# Patient Record
Sex: Female | Born: 1990 | Race: White | Hispanic: No | Marital: Single
Health system: Southern US, Community
[De-identification: ages and names within clinical notes are randomized; demographics above are authoritative.]

## PROBLEM LIST (undated history)

## (undated) ENCOUNTER — Emergency Department (HOSPITAL_COMMUNITY): Payer: Federal, State, Local not specified - PPO

## (undated) DIAGNOSIS — F988 Other specified behavioral and emotional disorders with onset usually occurring in childhood and adolescence: Secondary | ICD-10-CM

## (undated) HISTORY — PX: NO PAST SURGERIES: SHX2092

## (undated) HISTORY — DX: Other specified behavioral and emotional disorders with onset usually occurring in childhood and adolescence: F98.8

---
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
## 2013-07-25 ENCOUNTER — Encounter: Payer: Self-pay | Admitting: *Deleted

## 2016-08-31 ENCOUNTER — Emergency Department (HOSPITAL_COMMUNITY)
Admission: EM | Admit: 2016-08-31 | Discharge: 2016-08-31 | Disposition: A | Discharge status: Home / Self Care | Payer: Federal, State, Local not specified - PPO | Attending: Emergency Medicine | Admitting: Emergency Medicine

## 2016-08-31 ENCOUNTER — Emergency Department (HOSPITAL_COMMUNITY): Payer: Federal, State, Local not specified - PPO

## 2016-08-31 DIAGNOSIS — Z79899 Other long term (current) drug therapy: Secondary | ICD-10-CM | POA: Insufficient documentation

## 2016-08-31 DIAGNOSIS — F909 Attention-deficit hyperactivity disorder, unspecified type: Secondary | ICD-10-CM | POA: Diagnosis not present

## 2016-08-31 DIAGNOSIS — R1031 Right lower quadrant pain: Secondary | ICD-10-CM | POA: Diagnosis present

## 2016-08-31 LAB — URINALYSIS, ROUTINE W REFLEX MICROSCOPIC
BILIRUBIN URINE: NEGATIVE
GLUCOSE, UA: NEGATIVE mg/dL
KETONES UR: 5 mg/dL — AB
LEUKOCYTES UA: NEGATIVE
NITRITE: NEGATIVE
PH: 5 (ref 5.0–8.0)
Protein, ur: NEGATIVE mg/dL
Specific Gravity, Urine: 1.024 (ref 1.005–1.030)

## 2016-08-31 LAB — PREGNANCY, URINE: PREG TEST UR: NEGATIVE

## 2016-08-31 NOTE — ED Provider Notes (Signed)
[facility]-EMERGENCY DEPT Provider Note   CSN: [id number] Arrival date & time: 08/31/16  4098     History   Chief Complaint Chief Complaint  Patient presents with  . Abdominal Pain    RLQ  . Urinary Tract Infection    HPI Heidi Malone is a 26 y.o. female.  HPI Patient presents emergency para complaints of severe right lower quadrant abdominal pain which began acutely this morning.  Associated nausea without vomiting.  She now reports complete resolution of all of her pain.  She started her menstrual cycle today.  No prior history kidney stones.  She does have urinary frequency without dysuria.  She denies flank pain at this time.  No new vaginal complaints.  No other issues.   Past Medical History:  Diagnosis Date  . ADD (attention deficit disorder)     There are no active problems to display for this patient.   Past Surgical History:  Procedure Laterality Date  . NO PAST SURGERIES      OB History    No data available       Home Medications    Prior to Admission medications   Medication Sig Start Date End Date Taking? Authorizing Provider  etonogestrel-ethinyl estradiol (NUVARING) 0.12-0.015 MG/24HR vaginal ring Place 1 each vaginally every 28 (twenty-eight) days. Insert vaginally and leave in place for 3 consecutive weeks, then remove for 1 week.   Yes Historical Provider, MD  ibuprofen (ADVIL,MOTRIN) 200 MG tablet Take 400 mg by mouth every 6 (six) hours as needed for moderate pain.   Yes Historical Provider, MD  amphetamine-dextroamphetamine (ADDERALL XR) 25 MG 24 hr capsule Take 25 mg by mouth as needed.     Historical Provider, MD    Family History No family history on file.  Social History Social History  Substance Use Topics  . Smoking status: Not on file  . Smokeless tobacco: Not on file  . Alcohol use Not on file     Allergies   Patient has no known allergies.   Review of Systems Review of Systems  All other systems reviewed and are  negative.    Physical Exam Updated Vital Signs BP 126/72   Pulse 65   Temp 97.3 F (36.3 C) (Oral)   Ht 5\' 10"  (1.778 m)   Wt 150 lb (68 kg)   LMP 08/31/2016 (Exact Date)   SpO2 100%   BMI 21.52 kg/m   Physical Exam  Constitutional: She is oriented to person, place, and time. She appears well-developed and well-nourished.  HENT:  Head: Normocephalic.  Eyes: EOM are normal.  Neck: Normal range of motion.  Pulmonary/Chest: Effort normal.  Abdominal: She exhibits no distension. There is no tenderness. There is no rebound and no guarding.  Musculoskeletal: Normal range of motion.  Neurological: She is alert and oriented to person, place, and time.  Psychiatric: She has a normal mood and affect.  Nursing note and vitals reviewed.    ED Treatments / Results  Labs (all labs ordered are listed, but only abnormal results are displayed) Labs Reviewed  URINALYSIS, ROUTINE W REFLEX MICROSCOPIC - Abnormal; Notable for the following:       Result Value   Hgb urine dipstick SMALL (*)    Ketones, ur 5 (*)    Bacteria, UA RARE (*)    Squamous Epithelial / LPF 0-5 (*)    All other components within normal limits  URINE CULTURE  PREGNANCY, URINE    EKG  EKG Interpretation None  Radiology [facility]  Result Date: 08/31/2016 CLINICAL DATA:  Colicky right sided pain since this morning. EXAM: RENAL / URINARY TRACT ULTRASOUND COMPLETE COMPARISON:  None. FINDINGS: Right Kidney: Length: 11.7 cm. Echogenicity within normal limits. No mass or hydronephrosis visualized. Left Kidney: Length: 11.8 cm. Echogenicity within normal limits. No mass or hydronephrosis visualized. Bladder: Appears normal for degree of bladder distention. Bilateral ureteral jets are seen in the bladder. IMPRESSION: Normal ultrasound of the kidneys and bladder.  No hydronephrosis. Electronically Signed   By: Delbert PhenixJason A Poff M.D.   On: 08/31/2016 11:34    Procedures Procedures (including critical care  time)  Medications Ordered in ED Medications - No data to display   Initial Impression / Assessment and Plan / ED Course  I have reviewed the triage vital signs and the nursing notes.  Pertinent labs & imaging results that were available during my care of the patient were reviewed by me and considered in my medical decision making (see chart for details).     Resolution of pain.  Patient does have hematuria without signs of urinary tract infection.  Urine pregnancy test is negative.  Could represent a distal right ureteral stone.  Renal ultrasound pending at this time.  Doubt ovarian torsion.  No history of ovarian cyst.  Asymptomatic at this time  Patient continues to feel good.  Urine culture sent.  Renal ultrasound demonstrated no obstruction.  Could represent an ovarian cyst as well.  Well-appearing.  No indication for additional workup.  Final Clinical Impressions(s) / ED Diagnoses   Final diagnoses:  None    New Prescriptions New Prescriptions   No medications on file     Azalia BilisKevin Rodolphe Edmonston, MD 08/31/16 1153

## 2016-08-31 NOTE — ED Triage Notes (Signed)
Pt presents to [facility]ED from home via POA c/o RLQ sharp, burning pain with accompanied nausea. She rates pain 8/10. Pain onset around 7am. She said pain was so severe and sharp that it was concerning to her. Pain fluctuates in intensity from 6-10/10. Pt did start her menstrual cycle this morning but feels this pain is not similar to normal menstrual cycles.   She is also concerned that she has a UTI. She complains of urinary frequency, small volumes when she urinates. Onset this morning.

## 2016-09-01 LAB — URINE CULTURE

## 2017-11-17 IMAGING — US US RENAL
1 series · 14 of 25 positions shown · non-contrast
Comparison: None.

CLINICAL DATA: Colicky right sided pain since this morning.

EXAM:
RENAL / URINARY TRACT ULTRASOUND COMPLETE

[Series 1: us renal · 0.23mm/px · 14 of 37 slices shown]
[im 1/37]
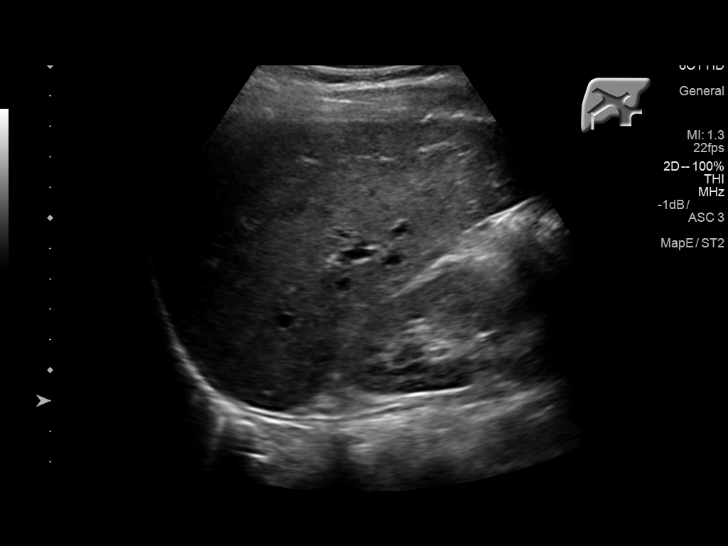
[im 4/37]
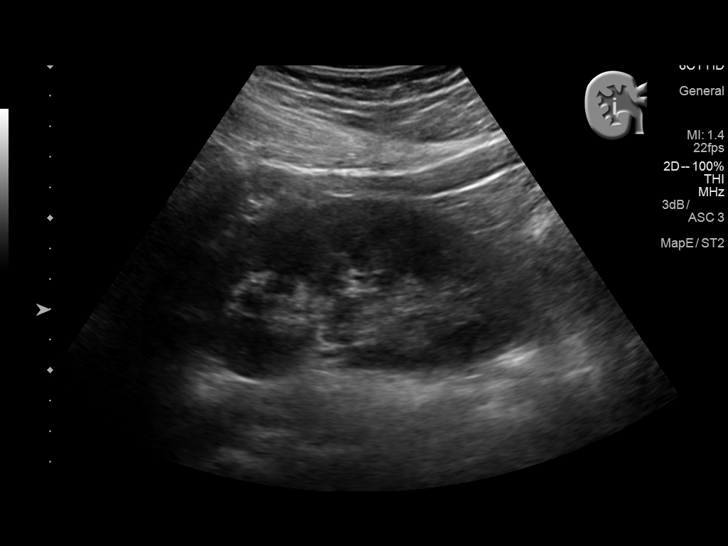
[im 7/37]
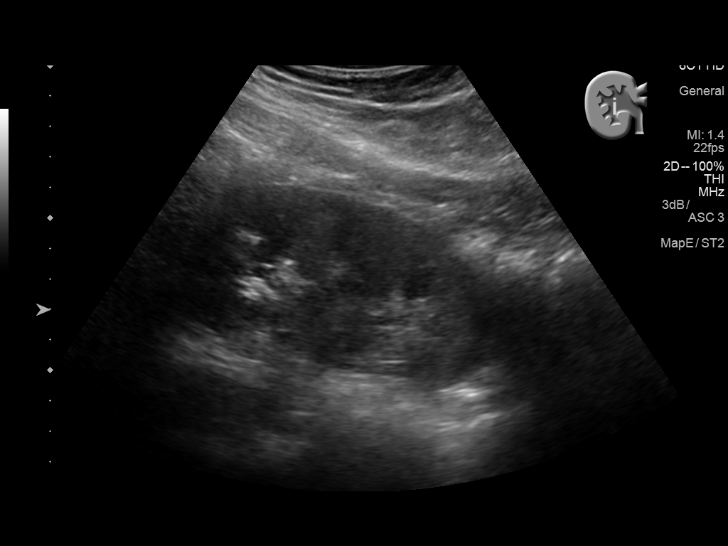
[im 10/37]
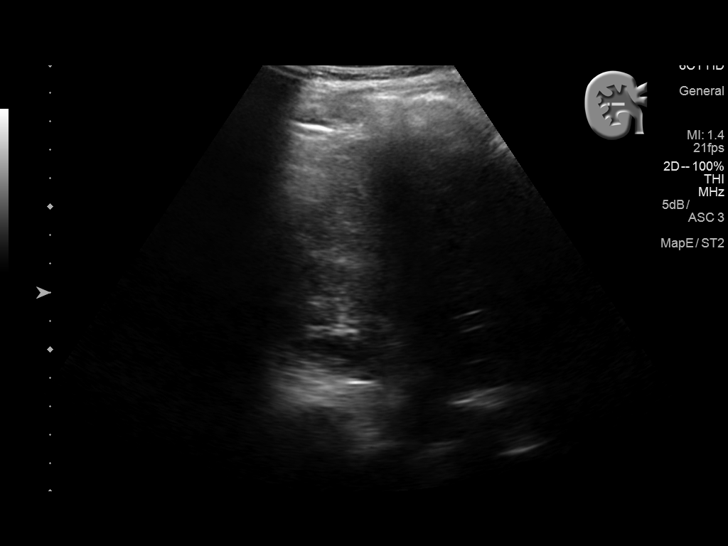
[im 13/37]
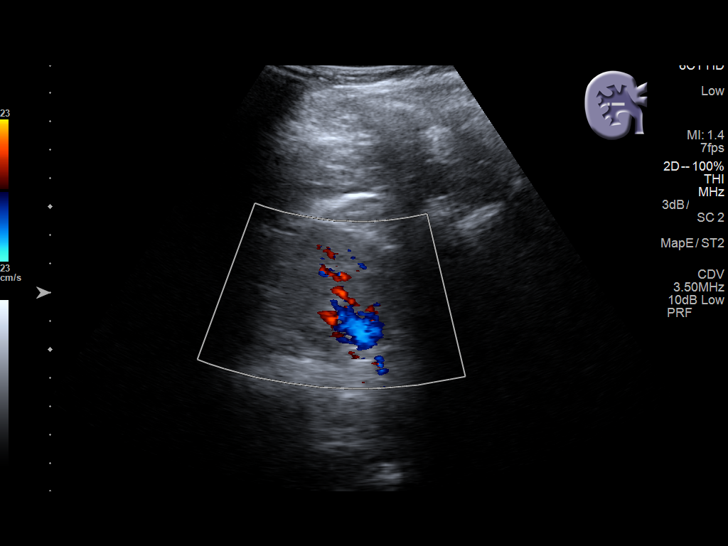
[im 14/37]
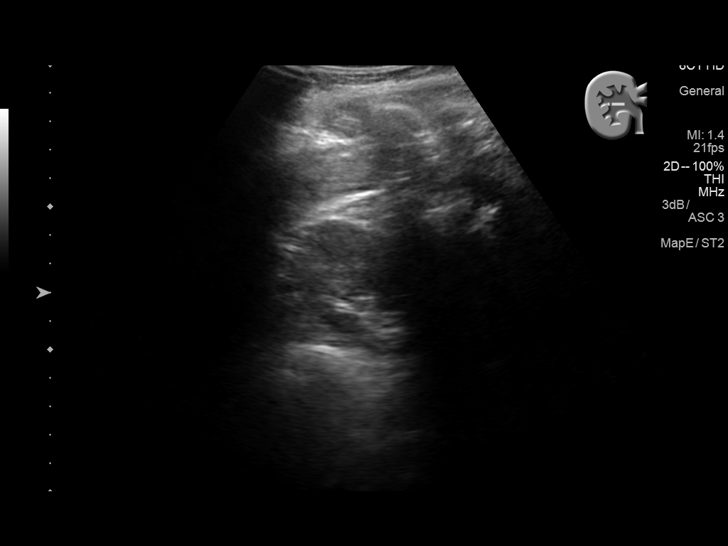
[im 17/37]
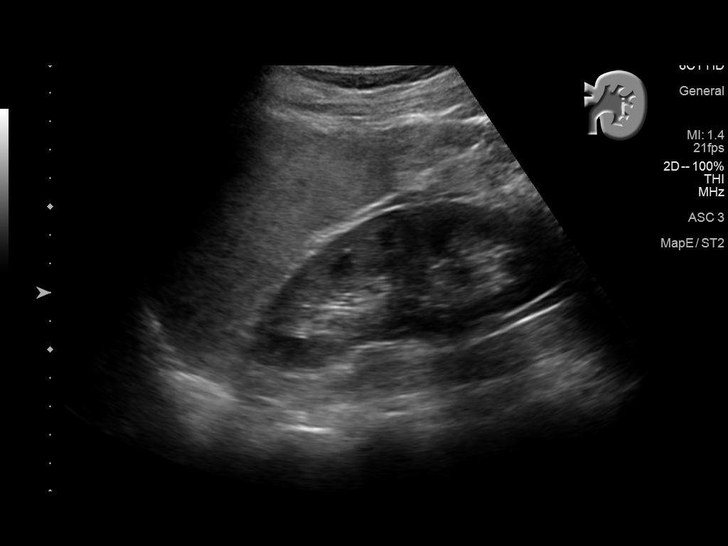
[im 20/37]
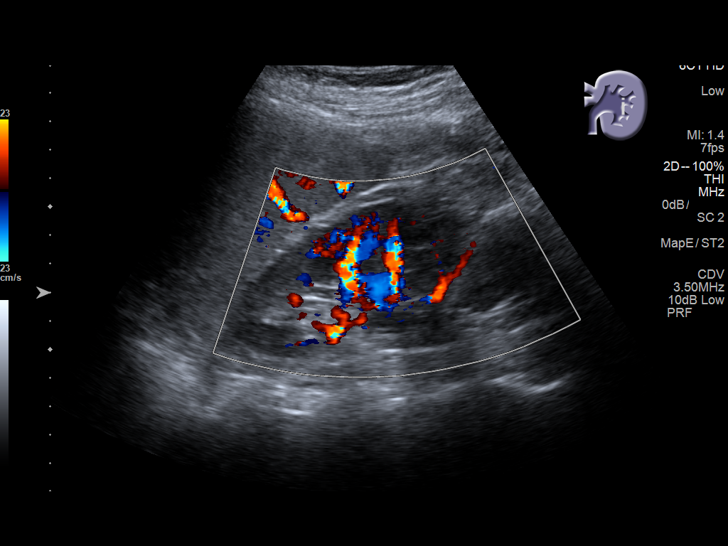
[im 23/37]
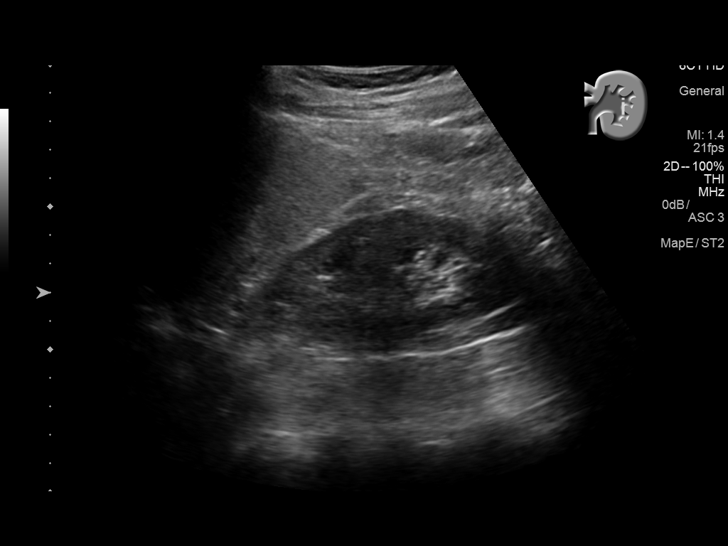
[im 25/37]
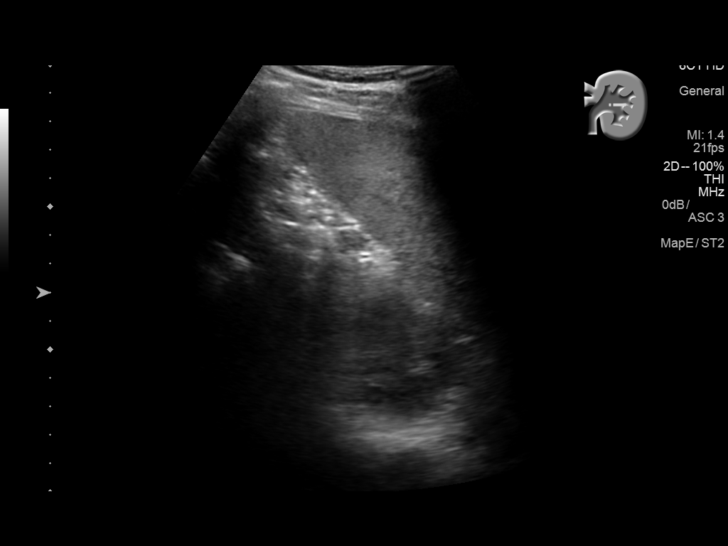
[im 28/37]
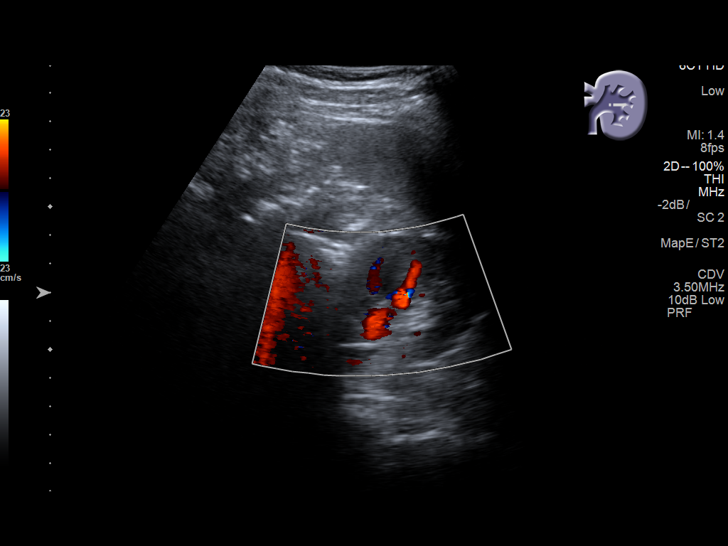
[im 31/37]
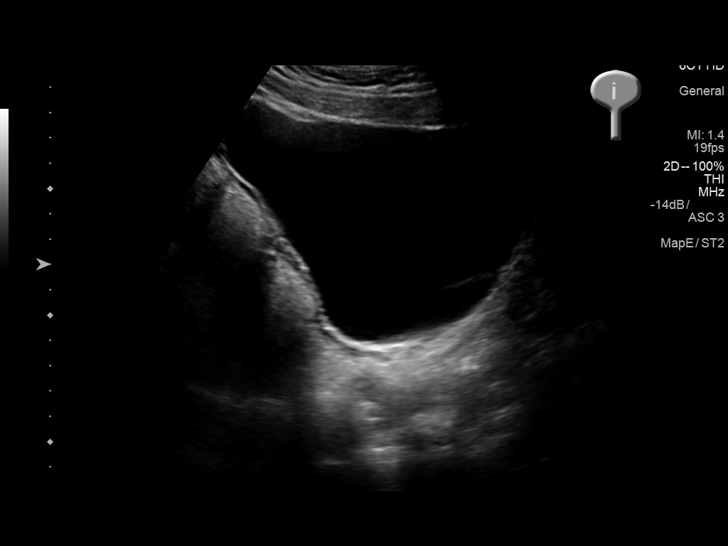
[im 34/37]
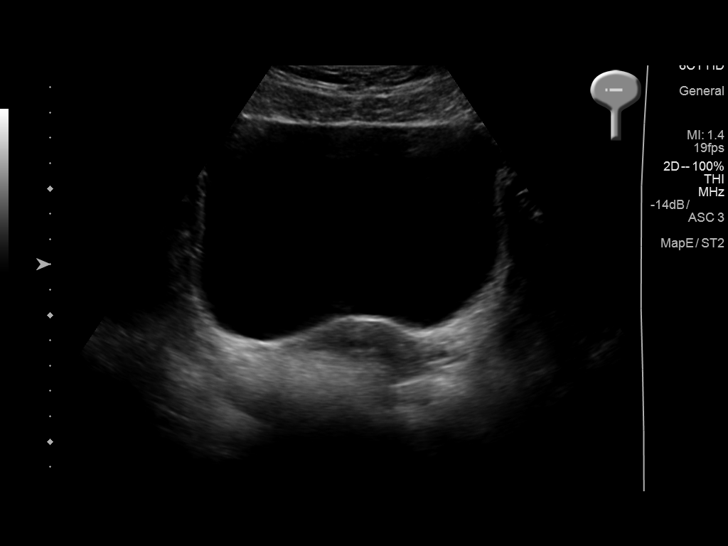
[im 37/37]
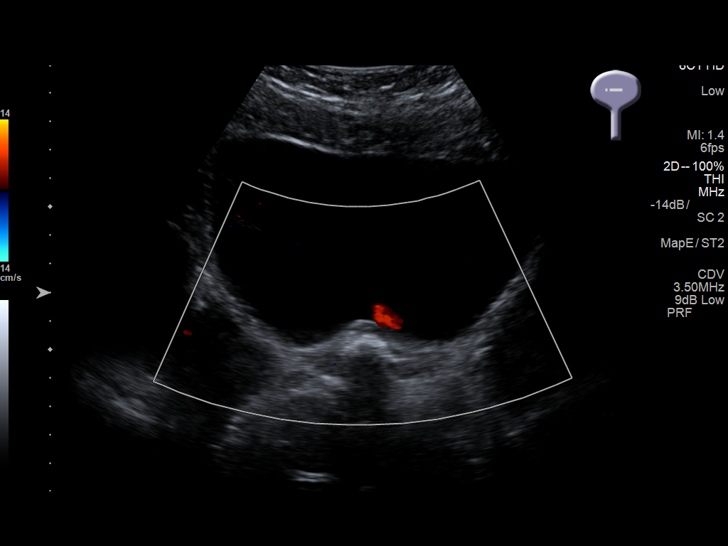

[14 of 25 positions shown; findings below may reference images not displayed]

FINDINGS: Right Kidney:

Length: 11.7 cm. Echogenicity within normal limits. No mass or
hydronephrosis visualized.

Left Kidney:

Length: 11.8 cm. Echogenicity within normal limits. No mass or
hydronephrosis visualized.

Bladder:

Appears normal for degree of bladder distention. Bilateral ureteral
jets are seen in the bladder.
IMPRESSION: Normal ultrasound of the kidneys and bladder.  No hydronephrosis.

## 2019-09-14 DIAGNOSIS — L700 Acne vulgaris: Secondary | ICD-10-CM | POA: Diagnosis not present

## 2019-09-17 DIAGNOSIS — F321 Major depressive disorder, single episode, moderate: Secondary | ICD-10-CM | POA: Diagnosis not present

## 2019-09-29 DIAGNOSIS — F321 Major depressive disorder, single episode, moderate: Secondary | ICD-10-CM | POA: Diagnosis not present

## 2019-11-06 DIAGNOSIS — R3989 Other symptoms and signs involving the genitourinary system: Secondary | ICD-10-CM | POA: Diagnosis not present

## 2019-11-06 DIAGNOSIS — Z3009 Encounter for other general counseling and advice on contraception: Secondary | ICD-10-CM | POA: Diagnosis not present

## 2019-11-06 DIAGNOSIS — Z113 Encounter for screening for infections with a predominantly sexual mode of transmission: Secondary | ICD-10-CM | POA: Diagnosis not present

## 2019-11-24 DIAGNOSIS — F321 Major depressive disorder, single episode, moderate: Secondary | ICD-10-CM | POA: Diagnosis not present

## 2019-11-25 DIAGNOSIS — R350 Frequency of micturition: Secondary | ICD-10-CM | POA: Diagnosis not present

## 2019-11-25 DIAGNOSIS — R300 Dysuria: Secondary | ICD-10-CM | POA: Diagnosis not present

## 2019-11-30 DIAGNOSIS — L700 Acne vulgaris: Secondary | ICD-10-CM | POA: Diagnosis not present

## 2019-11-30 DIAGNOSIS — F321 Major depressive disorder, single episode, moderate: Secondary | ICD-10-CM | POA: Diagnosis not present

## 2019-12-03 DIAGNOSIS — Z20828 Contact with and (suspected) exposure to other viral communicable diseases: Secondary | ICD-10-CM | POA: Diagnosis not present

## 2019-12-03 DIAGNOSIS — J029 Acute pharyngitis, unspecified: Secondary | ICD-10-CM | POA: Diagnosis not present

## 2019-12-14 DIAGNOSIS — F321 Major depressive disorder, single episode, moderate: Secondary | ICD-10-CM | POA: Diagnosis not present

## 2019-12-21 DIAGNOSIS — F321 Major depressive disorder, single episode, moderate: Secondary | ICD-10-CM | POA: Diagnosis not present

## 2020-01-04 DIAGNOSIS — F321 Major depressive disorder, single episode, moderate: Secondary | ICD-10-CM | POA: Diagnosis not present

## 2020-01-18 DIAGNOSIS — F321 Major depressive disorder, single episode, moderate: Secondary | ICD-10-CM | POA: Diagnosis not present

## 2020-01-25 DIAGNOSIS — J029 Acute pharyngitis, unspecified: Secondary | ICD-10-CM | POA: Diagnosis not present

## 2020-02-01 DIAGNOSIS — F321 Major depressive disorder, single episode, moderate: Secondary | ICD-10-CM | POA: Diagnosis not present

## 2020-02-15 DIAGNOSIS — F321 Major depressive disorder, single episode, moderate: Secondary | ICD-10-CM | POA: Diagnosis not present

## 2020-02-24 DIAGNOSIS — F321 Major depressive disorder, single episode, moderate: Secondary | ICD-10-CM | POA: Diagnosis not present

## 2020-03-14 DIAGNOSIS — F321 Major depressive disorder, single episode, moderate: Secondary | ICD-10-CM | POA: Diagnosis not present

## 2020-04-18 DIAGNOSIS — F321 Major depressive disorder, single episode, moderate: Secondary | ICD-10-CM | POA: Diagnosis not present

## 2020-04-29 DIAGNOSIS — R059 Cough, unspecified: Secondary | ICD-10-CM | POA: Diagnosis not present

## 2020-04-29 DIAGNOSIS — Z20822 Contact with and (suspected) exposure to covid-19: Secondary | ICD-10-CM | POA: Diagnosis not present

## 2020-04-29 DIAGNOSIS — R0981 Nasal congestion: Secondary | ICD-10-CM | POA: Diagnosis not present

## 2020-05-02 DIAGNOSIS — J019 Acute sinusitis, unspecified: Secondary | ICD-10-CM | POA: Diagnosis not present

## 2020-05-02 DIAGNOSIS — F321 Major depressive disorder, single episode, moderate: Secondary | ICD-10-CM | POA: Diagnosis not present

## 2020-05-30 DIAGNOSIS — F321 Major depressive disorder, single episode, moderate: Secondary | ICD-10-CM | POA: Diagnosis not present

## 2020-06-13 DIAGNOSIS — F321 Major depressive disorder, single episode, moderate: Secondary | ICD-10-CM | POA: Diagnosis not present
